# Patient Record
Sex: Male | Born: 1975 | Race: White | Hispanic: No | Smoking: Current every day smoker
Health system: Southern US, Community
[De-identification: ages and names within clinical notes are randomized; demographics above are authoritative.]

## PROBLEM LIST (undated history)

## (undated) DIAGNOSIS — I10 Essential (primary) hypertension: Secondary | ICD-10-CM

## (undated) DIAGNOSIS — M5030 Other cervical disc degeneration, unspecified cervical region: Secondary | ICD-10-CM

## (undated) DIAGNOSIS — E079 Disorder of thyroid, unspecified: Secondary | ICD-10-CM

## (undated) DIAGNOSIS — F319 Bipolar disorder, unspecified: Secondary | ICD-10-CM

## (undated) HISTORY — PX: INGUINAL HERNIA REPAIR: SUR1180

---

## 2005-08-20 ENCOUNTER — Ambulatory Visit: Payer: Self-pay | Admitting: Family Medicine

## 2005-10-02 ENCOUNTER — Encounter: Admission: RE | Admit: 2005-10-02 | Discharge: 2005-10-02 | Payer: Self-pay | Admitting: Orthopedic Surgery

## 2007-04-23 ENCOUNTER — Ambulatory Visit: Payer: Self-pay | Admitting: Anesthesiology

## 2007-05-06 ENCOUNTER — Ambulatory Visit: Payer: Self-pay | Admitting: Anesthesiology

## 2007-06-17 ENCOUNTER — Ambulatory Visit: Payer: Self-pay | Admitting: Anesthesiology

## 2007-07-15 ENCOUNTER — Ambulatory Visit: Payer: Self-pay | Admitting: Anesthesiology

## 2007-08-10 ENCOUNTER — Ambulatory Visit: Payer: Self-pay | Admitting: Internal Medicine

## 2007-08-20 ENCOUNTER — Ambulatory Visit: Payer: Self-pay | Admitting: Anesthesiology

## 2007-10-21 ENCOUNTER — Ambulatory Visit: Payer: Self-pay | Admitting: Anesthesiology

## 2008-01-14 ENCOUNTER — Ambulatory Visit: Payer: Self-pay | Admitting: Anesthesiology

## 2008-03-09 ENCOUNTER — Ambulatory Visit: Payer: Self-pay | Admitting: Dermatology

## 2008-06-06 ENCOUNTER — Ambulatory Visit: Payer: Self-pay | Admitting: Anesthesiology

## 2008-07-14 ENCOUNTER — Ambulatory Visit: Payer: Self-pay | Admitting: Anesthesiology

## 2008-10-10 ENCOUNTER — Ambulatory Visit: Payer: Self-pay | Admitting: Anesthesiology

## 2008-11-09 ENCOUNTER — Ambulatory Visit: Payer: Self-pay | Admitting: Anesthesiology

## 2009-01-13 ENCOUNTER — Ambulatory Visit: Payer: Self-pay | Admitting: Anesthesiology

## 2009-03-31 ENCOUNTER — Ambulatory Visit: Payer: Self-pay | Admitting: Anesthesiology

## 2009-07-13 ENCOUNTER — Ambulatory Visit: Payer: Self-pay | Admitting: Anesthesiology

## 2009-10-25 ENCOUNTER — Ambulatory Visit: Payer: Self-pay | Admitting: Anesthesiology

## 2010-01-05 ENCOUNTER — Ambulatory Visit: Payer: Self-pay | Admitting: Anesthesiology

## 2010-07-03 ENCOUNTER — Ambulatory Visit: Payer: Self-pay | Admitting: Anesthesiology

## 2010-12-04 ENCOUNTER — Ambulatory Visit: Payer: Self-pay | Admitting: Anesthesiology

## 2011-02-01 ENCOUNTER — Ambulatory Visit: Payer: Self-pay | Admitting: Anesthesiology

## 2011-07-04 ENCOUNTER — Ambulatory Visit: Payer: Self-pay | Admitting: Anesthesiology

## 2015-11-14 ENCOUNTER — Emergency Department
Admission: EM | Admit: 2015-11-14 | Discharge: 2015-11-14 | Disposition: A | Discharge status: Home / Self Care | Payer: Medicare Other | Attending: Emergency Medicine | Admitting: Emergency Medicine

## 2015-11-14 ENCOUNTER — Encounter: Payer: Self-pay | Admitting: Emergency Medicine

## 2015-11-14 DIAGNOSIS — F17210 Nicotine dependence, cigarettes, uncomplicated: Secondary | ICD-10-CM | POA: Diagnosis not present

## 2015-11-14 DIAGNOSIS — G542 Cervical root disorders, not elsewhere classified: Secondary | ICD-10-CM | POA: Insufficient documentation

## 2015-11-14 DIAGNOSIS — I10 Essential (primary) hypertension: Secondary | ICD-10-CM | POA: Diagnosis not present

## 2015-11-14 DIAGNOSIS — M542 Cervicalgia: Secondary | ICD-10-CM | POA: Diagnosis present

## 2015-11-14 HISTORY — DX: Other cervical disc degeneration, unspecified cervical region: M50.30

## 2015-11-14 HISTORY — DX: Disorder of thyroid, unspecified: E07.9

## 2015-11-14 HISTORY — DX: Essential (primary) hypertension: I10

## 2015-11-14 HISTORY — DX: Bipolar disorder, unspecified: F31.9

## 2015-11-14 MED ORDER — PREDNISONE 10 MG (21) PO TBPK: ORAL_TABLET | ORAL | 0 refills | Status: AC

## 2015-11-14 MED ORDER — CYCLOBENZAPRINE HCL 10 MG PO TABS: 10.0000 mg | ORAL_TABLET | Freq: Three times a day (TID) | ORAL | 0 refills | Status: AC | PRN

## 2015-11-14 MED ORDER — MELOXICAM 15 MG PO TABS: 15.0000 mg | ORAL_TABLET | Freq: Every day | ORAL | 0 refills | Status: AC

## 2015-11-14 NOTE — ED Triage Notes (Addendum)
Pt states that he woke up four days ago with right shoulder pain. States that arm is now swollen and having a lot of pain in right arm.  Hx of degenerative disc disease.  Pt says that he has constantly iced arm and taken advil x 4 days. States he also has pain in neck as well. States that his pain moves around and hurts worse when sitting up.

## 2015-11-14 NOTE — ED Provider Notes (Signed)
[facility] Emergency Department Provider Note ____________________________________________  Time seen: Approximately 08:49 AM  I have reviewed the triage vital signs and the nursing notes.   HISTORY  Chief Complaint No chief complaint on file.    HPI Alex Clarke is a 40 y.o. male who presents to the emergency department for evaluation of right arm pain and swelling. Pain is acute on chronic and has been worsening over the past 24 hours. Pain is shooting up into the neck and shoulder on the right side.He also states pain goes into the right hand and ring and pinky finger. He has no relief with ibuprofen. He states that he recently had an adjustment to his thyroid medication otherwise no changes to his medications, health status, or injury.  Past Medical History:  Diagnosis Date  . Bipolar 1 disorder (HCC)   . Degenerative disc disease, cervical   . Hypertension   . Thyroid disease     There are no active problems to display for this patient.   Past Surgical History:  Procedure Laterality Date  . INGUINAL HERNIA REPAIR      Prior to Admission medications   Medication Sig Start Date End Date Taking? Authorizing Provider  cyclobenzaprine (FLEXERIL) 10 MG tablet Take 1 tablet (10 mg total) by mouth 3 (three) times daily as needed for muscle spasms. 11/14/15   Chinita Pesterari B Modell Fendrick, FNP  meloxicam (MOBIC) 15 MG tablet Take 1 tablet (15 mg total) by mouth daily. 11/14/15   Chinita Pesterari B Astin Rape, FNP  predniSONE (STERAPRED UNI-PAK 21 TAB) 10 MG (21) TBPK tablet Take 6 tablets on day 1 Take 5 tablets on day 2 Take 4 tablets on day 3 Take 3 tablets on day 4 Take 2 tablets on day 5 Take 1 tablet on day 6 11/14/15   Chinita Pesterari B Maxie Debose, FNP    Allergies Review of patient's allergies indicates no known allergies.  History reviewed. No pertinent family history.  Social History Social History  Substance Use Topics  . Smoking status: Current Every Day Smoker   Packs/day: 1.00    Types: Cigarettes  . Smokeless tobacco: Never Used  . Alcohol use No    Review of Systems Constitutional: No recent illness. Cardiovascular: Denies chest pain or palpitations. Respiratory: Denies shortness of breath. Musculoskeletal: Pain in right neck, shoulder, and arm. Skin: Negative for rash, wound, lesion. Neurological: Negative for focal weakness or numbness.  ____________________________________________   PHYSICAL EXAM:  VITAL SIGNS: ED Triage Vitals  Enc Vitals Group     BP 11/14/15 0826 (!) 159/89     Pulse Rate 11/14/15 0826 (!) 110     Resp 11/14/15 0826 16     Temp 11/14/15 0826 98.3 F (36.8 C)     Temp Source 11/14/15 0826 Oral     SpO2 11/14/15 0826 99 %     Weight 11/14/15 0825 260 lb (117.9 kg)     Height 11/14/15 0825 6\' 7"  (2.007 m)     Head Circumference --      Peak Flow --      Pain Score 11/14/15 0825 10     Pain Loc --      Pain Edu? --      Excl. in GC? --     Constitutional: Alert and oriented. Well appearing and in no acute distress. Eyes: Conjunctivae are normal. EOMI. Head: Atraumatic. Neck: No stridor.  Respiratory: Normal respiratory effort.   Musculoskeletal: Spurling test positive--pain Reproducible. Active range of motion throughout. Neurologic:  Normal speech and language. No gross focal neurologic deficits are appreciated. Speech is normal. No gait instability. Normal motor function/no deficit. Skin:  Skin is warm, dry and intact. Atraumatic. Psychiatric: Mood and affect are normal. Speech and behavior are normal.  ____________________________________________   LABS (all labs ordered are listed, but only abnormal results are displayed)  Labs Reviewed - No data to display ____________________________________________  RADIOLOGY  Not indicated ____________________________________________   PROCEDURES  Procedure(s) performed: None   ____________________________________________   INITIAL  IMPRESSION / ASSESSMENT AND PLAN / ED COURSE  Clinical Course    Pertinent labs & imaging results that were available during my care of the patient were reviewed by me and considered in my medical decision making (see chart for details).  Patient was advised to follow-up with his neurosurgeon, Dr. Lovell Sheehan. He was advised to take medications as prescribed. He was advised to return to the emergency department for symptoms that change or worsen if he is unable to get an appointment. ____________________________________________   FINAL CLINICAL IMPRESSION(S) / ED DIAGNOSES  Final diagnoses:  Cervical nerve root impingement       Chinita Pester, FNP 11/14/15 1046    Jeanmarie Plant, MD 11/14/15 1546

## 2015-12-05 ENCOUNTER — Other Ambulatory Visit (HOSPITAL_COMMUNITY): Payer: Self-pay | Admitting: Neurosurgery

## 2015-12-05 DIAGNOSIS — M5412 Radiculopathy, cervical region: Secondary | ICD-10-CM

## 2015-12-20 ENCOUNTER — Ambulatory Visit
Admission: RE | Admit: 2015-12-20 | Discharge: 2015-12-20 | Disposition: A | Discharge status: Home / Self Care | Payer: Medicare Other | Source: Ambulatory Visit | Attending: Neurosurgery | Admitting: Neurosurgery

## 2015-12-20 DIAGNOSIS — M5010 Cervical disc disorder with radiculopathy, unspecified cervical region: Secondary | ICD-10-CM | POA: Diagnosis not present

## 2015-12-20 DIAGNOSIS — M899 Disorder of bone, unspecified: Secondary | ICD-10-CM | POA: Insufficient documentation

## 2015-12-20 DIAGNOSIS — M5412 Radiculopathy, cervical region: Secondary | ICD-10-CM | POA: Diagnosis present

## 2015-12-20 DIAGNOSIS — M50123 Cervical disc disorder at C6-C7 level with radiculopathy: Secondary | ICD-10-CM | POA: Diagnosis not present

## 2015-12-20 DIAGNOSIS — M4802 Spinal stenosis, cervical region: Secondary | ICD-10-CM | POA: Diagnosis not present

## 2017-02-21 IMAGING — MR MR CERVICAL SPINE W/O CM
5 series · 33 of 48 positions shown · non-contrast
Comparison: None.

CLINICAL DATA: Neck pain.  Right arm pain and numbness.  Weakness.

EXAM:
MRI CERVICAL SPINE WITHOUT CONTRAST
TECHNIQUE: Multiplanar, multisequence MR imaging of the cervical spine was
performed. No intravenous contrast was administered.

[Series 3: STIR · sagittal · 3.0mm · 0.37mm/px · 6 of 13 slices shown]
[im 1/13]
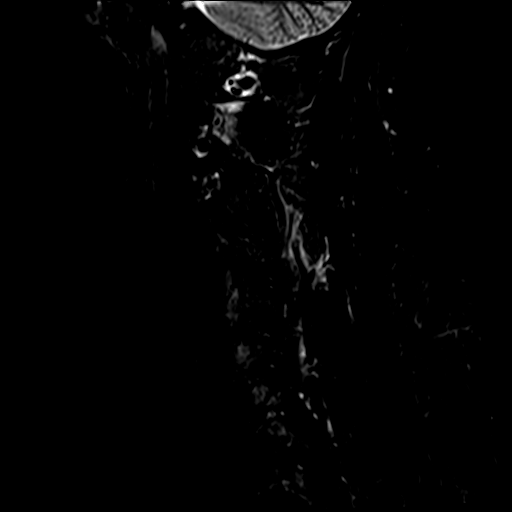
[im 3/13]
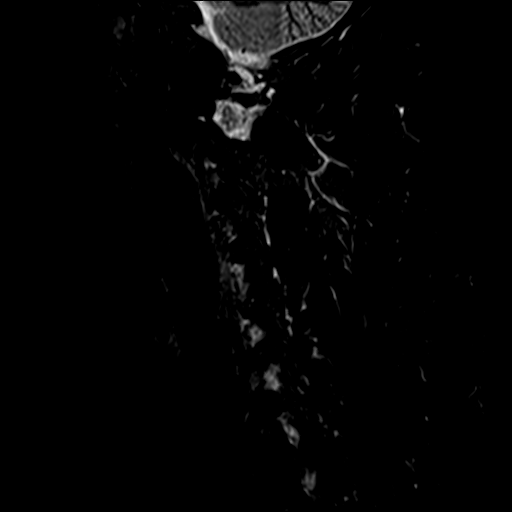
[im 5/13]
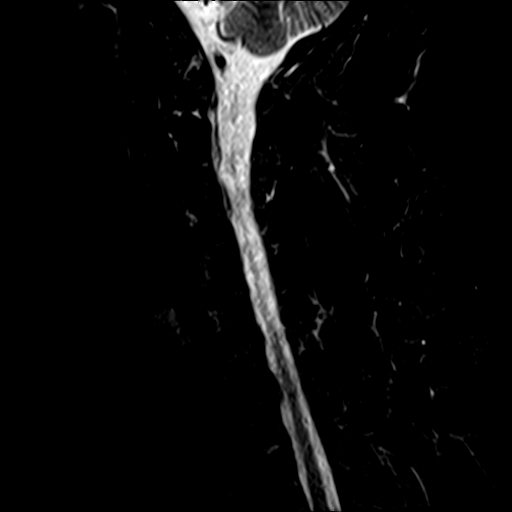
[im 8/13]
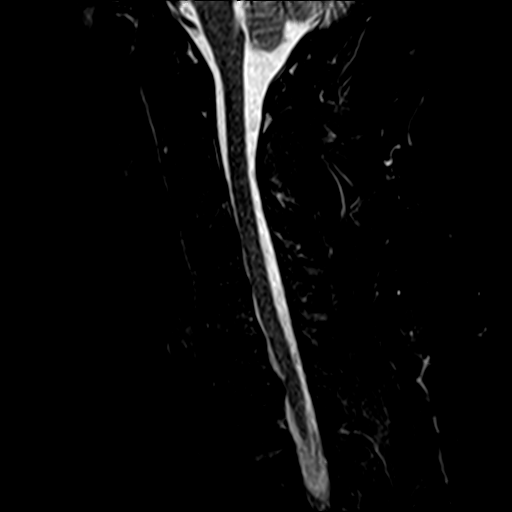
[im 10/13]
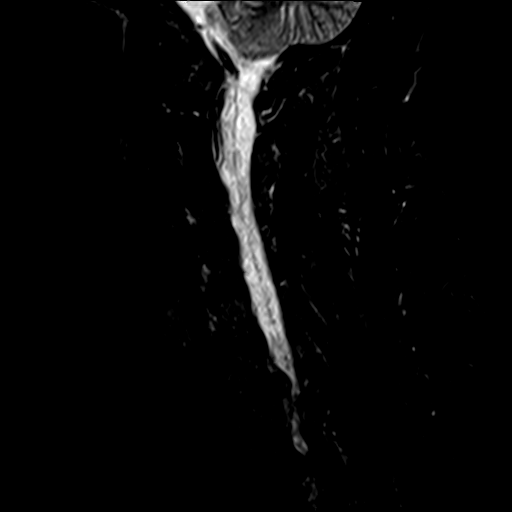
[im 13/13]
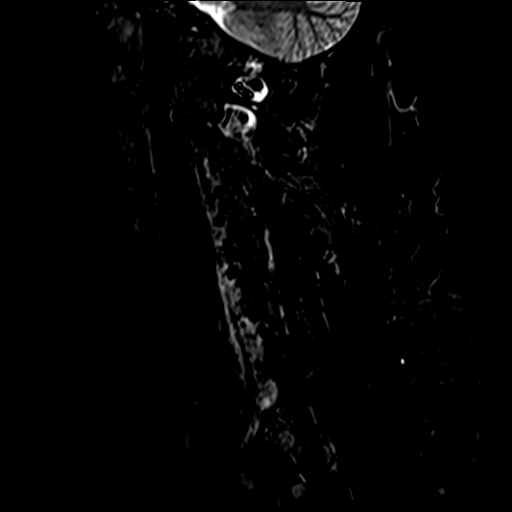

[Series 4: T1 · sagittal · 3.0mm · 0.74mm/px · 6 of 13 slices shown]
[im 1/13]
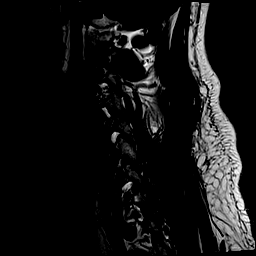
[im 3/13]
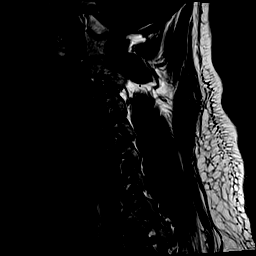
[im 5/13]
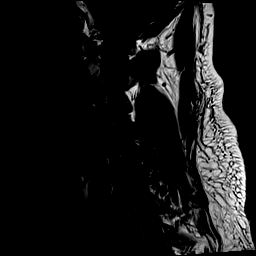
[im 8/13]
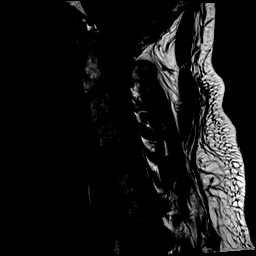
[im 10/13]
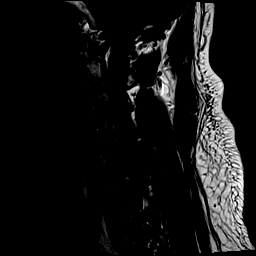
[im 13/13]
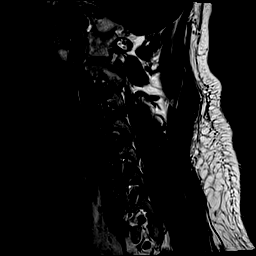

[Series 5: T2 · sagittal · 3.0mm · 0.59mm/px · 6 of 13 slices shown (1 of 2)]
[im 1/13]
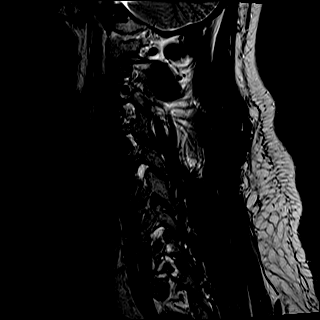
[im 3/13]
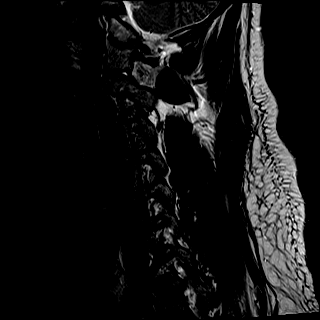
[im 5/13]
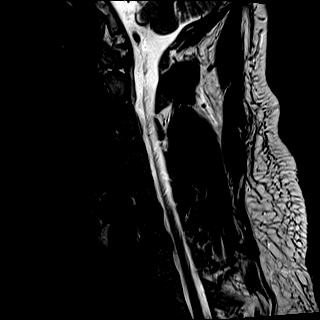
[im 8/13]
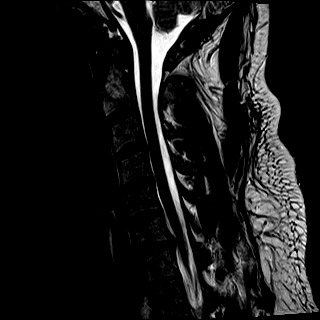
[im 10/13]
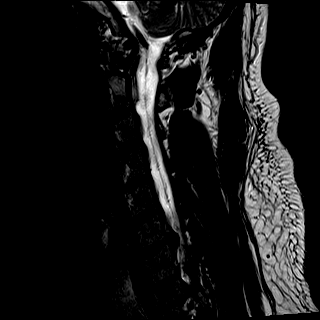
[im 13/13]
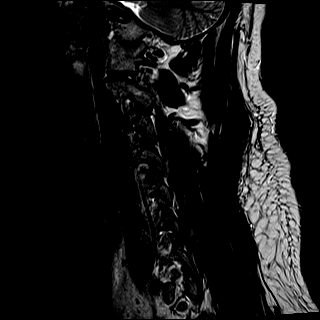

[Series 7: mpgr ax · axial · 3.0mm · 0.39mm/px · z∈[-96,-14]mm · 6 of 32 slices shown]
[im 1/32]
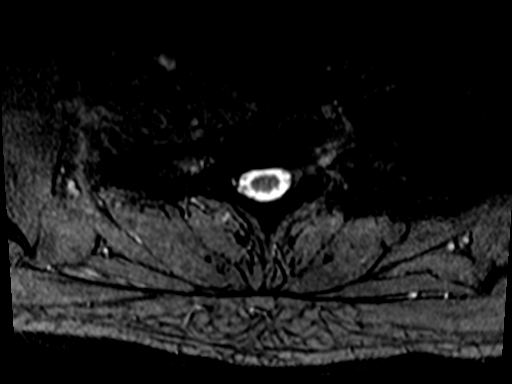
[im 5/32]
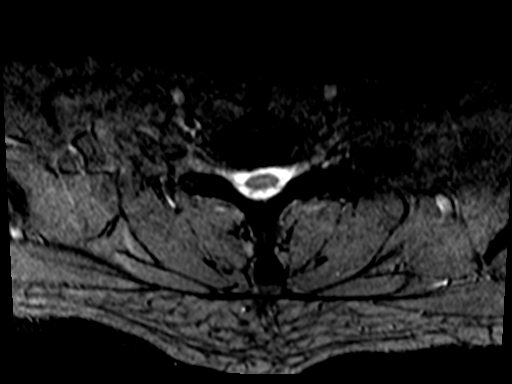
[im 9/32]
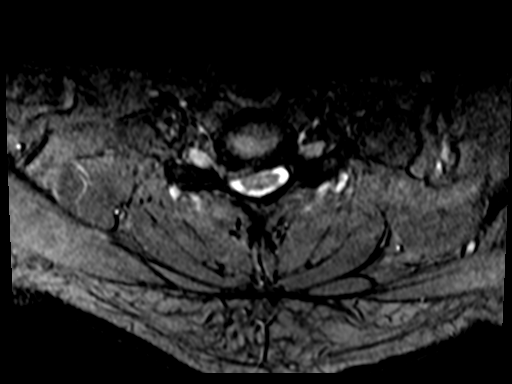
[im 14/32]
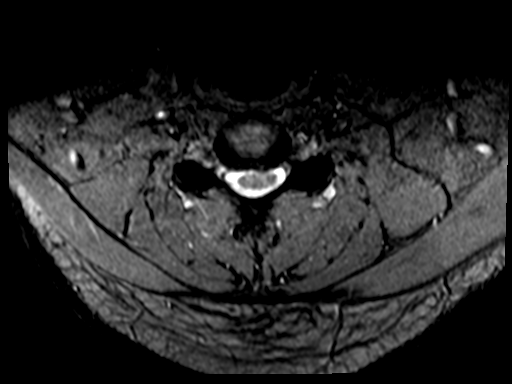
[im 18/32]
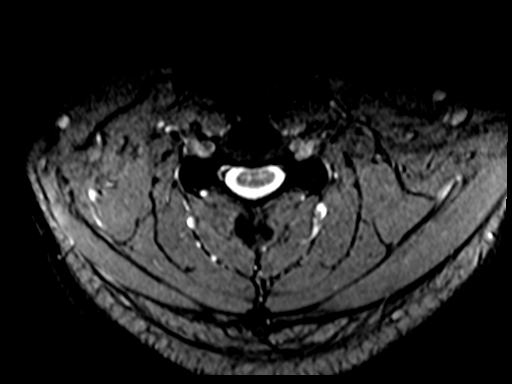
[im 23/32]
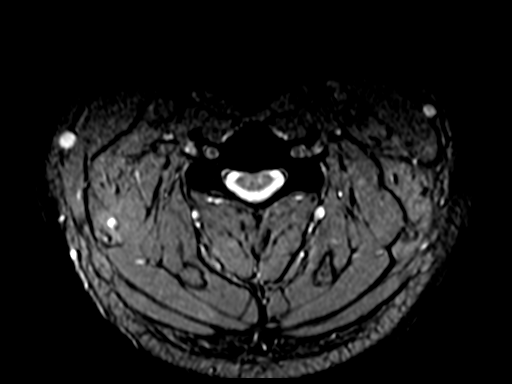

[Series 8: T2 · axial · 3.0mm · 0.62mm/px · z∈[-103,+13]mm · 9 of 32 slices shown (2 of 2)]
[im 1/32]
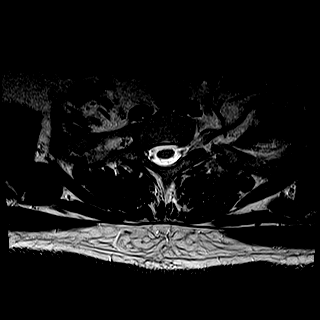
[im 5/32]
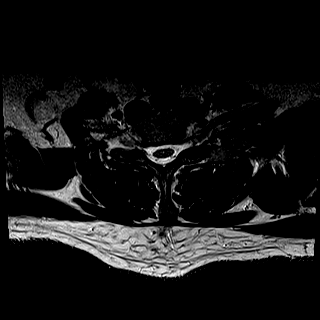
[im 9/32]
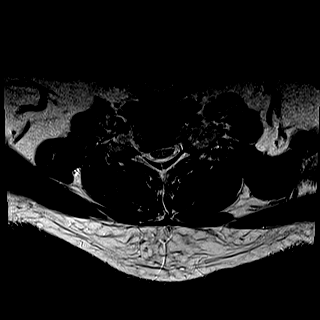
[im 14/32]
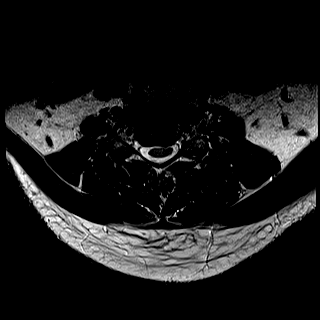
[im 16/32]
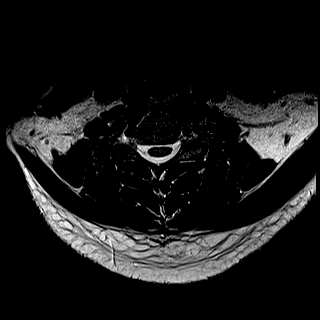
[im 18/32]
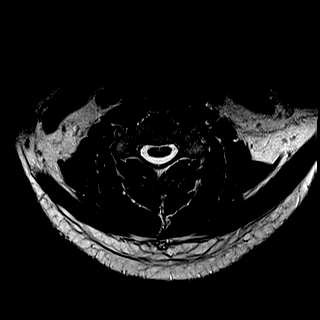
[im 23/32]
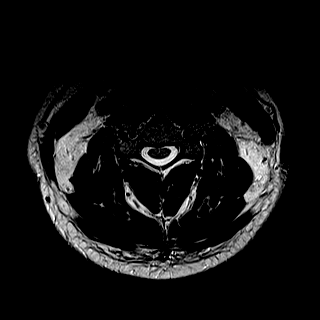
[im 27/32]
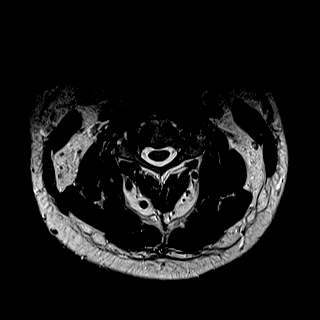
[im 32/32]
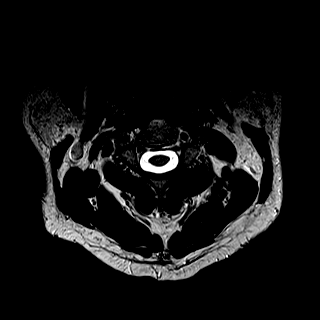

[33 of 48 positions shown; findings below may reference images not displayed]

FINDINGS: Alignment: No vertebral subluxation is observed.

Vertebrae: 5 by 4 mm mildly T2 hyperintense lesion in the C7
vertebral body, intermediate signal on T1 weighted images, probably
an atypical hemangioma but technically nonspecific.

Disc desiccation at all levels between C2 and C7.

Cord: No significant abnormal spinal cord signal is observed.

Posterior Fossa, vertebral arteries, paraspinal tissues:
Unremarkable

Disc levels:

C2-3:  Unremarkable

C3-4:  No impingement, mild disc bulge.

C4-5:  No impingement, small central disc protrusion.

C5-6: Borderline central narrowing of the thecal sac due to left
paracentral disc protrusion and disc bulge.

C6-7: Moderate to prominent right foraminal stenosis due to a right
lateral recess and medial foraminal large disc protrusion.

C7-T1:  No overt impingement.  Mild uncinate spurring.
IMPRESSION: 1. Moderate to prominent right foraminal stenosis due to a right
lateral recess and medial foraminal disc protrusion at the C6-7
level.
2. Lesser degenerative disc disease at other cervical levels,
without additional impingement.
3. Nonspecific 5 by 4 mm lesion in the C7 vertebral body, most
likely an atypical hemangioma but technically nonspecific.
